# Patient Record
Sex: Male | Born: 1990 | Race: White | Hispanic: No | Marital: Single | Smoking: Never smoker
Health system: Southern US, Community
[De-identification: ages and names within clinical notes are randomized; demographics above are authoritative.]

---

## 2014-11-30 ENCOUNTER — Ambulatory Visit (INDEPENDENT_AMBULATORY_CARE_PROVIDER_SITE_OTHER): Payer: BC Managed Care – PPO | Admitting: Internal Medicine

## 2014-11-30 ENCOUNTER — Ambulatory Visit (INDEPENDENT_AMBULATORY_CARE_PROVIDER_SITE_OTHER): Payer: BC Managed Care – PPO

## 2014-11-30 DIAGNOSIS — R079 Chest pain, unspecified: Secondary | ICD-10-CM

## 2014-11-30 DIAGNOSIS — R0789 Other chest pain: Secondary | ICD-10-CM

## 2014-11-30 DIAGNOSIS — S20219A Contusion of unspecified front wall of thorax, initial encounter: Secondary | ICD-10-CM

## 2014-11-30 DIAGNOSIS — S2020XA Contusion of thorax, unspecified, initial encounter: Secondary | ICD-10-CM

## 2014-11-30 DIAGNOSIS — S81802A Unspecified open wound, left lower leg, initial encounter: Secondary | ICD-10-CM

## 2014-11-30 MED ORDER — IBUPROFEN 600 MG PO TABS: 600.0000 mg | ORAL_TABLET | Freq: Three times a day (TID) | ORAL | Status: AC | PRN

## 2014-11-30 MED ORDER — MUPIROCIN 2 % EX OINT
1.0000 | TOPICAL_OINTMENT | Freq: Three times a day (TID) | CUTANEOUS | Status: AC
Start: 2014-11-30 — End: ?

## 2014-11-30 MED ORDER — METHOCARBAMOL 750 MG PO TABS: 750.0000 mg | ORAL_TABLET | Freq: Four times a day (QID) | ORAL | Status: AC

## 2014-11-30 NOTE — Patient Instructions (Signed)
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
